# Patient Record
Sex: Male | Born: 1935 | Race: Black or African American | Hispanic: No | Marital: Married | Smoking: Former smoker
Health system: Southern US, Community
[De-identification: ages and names within clinical notes are randomized; demographics above are authoritative.]

## PROBLEM LIST (undated history)

## (undated) DIAGNOSIS — I10 Essential (primary) hypertension: Secondary | ICD-10-CM

## (undated) DIAGNOSIS — J45909 Unspecified asthma, uncomplicated: Secondary | ICD-10-CM

---

## 2011-06-20 DIAGNOSIS — N411 Chronic prostatitis: Secondary | ICD-10-CM | POA: Insufficient documentation

## 2011-10-29 ENCOUNTER — Encounter: Payer: Self-pay | Admitting: *Deleted

## 2011-10-29 ENCOUNTER — Emergency Department (HOSPITAL_BASED_OUTPATIENT_CLINIC_OR_DEPARTMENT_OTHER)
Admission: EM | Admit: 2011-10-29 | Discharge: 2011-10-29 | Disposition: A | Discharge status: Home / Self Care | Payer: Medicare Other | Attending: Emergency Medicine | Admitting: Emergency Medicine

## 2011-10-29 DIAGNOSIS — I10 Essential (primary) hypertension: Secondary | ICD-10-CM | POA: Insufficient documentation

## 2011-10-29 DIAGNOSIS — J029 Acute pharyngitis, unspecified: Secondary | ICD-10-CM | POA: Insufficient documentation

## 2011-10-29 HISTORY — DX: Essential (primary) hypertension: I10

## 2011-10-29 NOTE — ED Notes (Signed)
Pt states he has had a sore throat today and the gland on the left side of his neck also hurts

## 2011-10-29 NOTE — ED Provider Notes (Signed)
History     CSN: [id number]  Arrival date & time 10/29/11  2202   First MD Initiated Contact with Patient 10/29/11 2259      Chief Complaint  Patient presents with  . Sore Throat    (Consider location/radiation/quality/duration/timing/severity/associated sxs/prior treatment) HPI Comments: Patient presents with one day of scratchy and sore throat.  He notes that this morning her scratching when he first woke up.  He went to church and after church started noticing increasing sore throat feeling.  He has pain with swallowing but can swallow normally.  No changes in his voice.  No difficulty breathing.  Denies any cough is not sure these have any fevers at home.  He feels that he may have a gland of the left side of his neck that is slightly swollen.  Denies any dental pain.  No nausea or vomiting.  No chest pain.  Patient is a 77 y.o. male presenting with pharyngitis. The history is provided by the patient.  Sore Throat This is a new problem. The current episode started 6 to 12 hours ago. The problem occurs constantly. The problem has been gradually worsening. Pertinent negatives include no chest pain, no abdominal pain, no headaches and no shortness of breath. The symptoms are aggravated by swallowing. The symptoms are relieved by nothing. He has tried nothing for the symptoms.    Past Medical History  Diagnosis Date  . Hypertension     History reviewed. No pertinent past surgical history.  History reviewed. No pertinent family history.  History  Substance Use Topics  . Smoking status: Never Smoker   . Smokeless tobacco: Not on file  . Alcohol Use: No      Review of Systems  Constitutional: Negative.  Negative for fever and chills.  HENT: Positive for sore throat.   Eyes: Negative.  Negative for discharge and redness.  Respiratory: Negative.  Negative for cough and shortness of breath.   Cardiovascular: Negative.  Negative for chest pain.  Gastrointestinal: Negative.   Negative for nausea, vomiting and abdominal pain.  Genitourinary: Negative.  Negative for hematuria.  Musculoskeletal: Negative.  Negative for back pain.  Skin: Negative.  Negative for color change and rash.  Neurological: Negative for syncope and headaches.  Hematological: Negative.  Negative for adenopathy.  Psychiatric/Behavioral: Negative.  Negative for confusion.  All other systems reviewed and are negative.    Allergies  Review of patient's allergies indicates no known allergies.  Home Medications   Current Outpatient Rx  Name Route Sig Dispense Refill  . AMLODIPINE BESYLATE 10 MG PO TABS Oral Take 10 mg by mouth daily.      . BUMETANIDE 1 MG PO TABS Oral Take 1 mg by mouth daily.      Marland Kitchen METOPROLOL TARTRATE 50 MG PO TABS Oral Take 50 mg by mouth 2 (two) times daily.        BP 175/82  Pulse 81  Temp(Src) 99.5 F (37.5 C) (Oral)  Resp 20  Ht 6\' 5"  (1.956 m)  Wt 270 lb (122.471 kg)  BMI 32.02 kg/m2  SpO2 98%  Physical Exam  Constitutional: He is oriented to person, place, and time. He appears well-developed and well-nourished.  Non-toxic appearance. He does not have a sickly appearance.  HENT:  Head: Normocephalic and atraumatic.  Mouth/Throat: Oropharynx is clear and moist. No oropharyngeal exudate.  Eyes: Conjunctivae, EOM and lids are normal. Pupils are equal, round, and reactive to light.  Neck: Trachea normal, normal range of motion and full  passive range of motion without pain. Neck supple.  Cardiovascular: Normal rate, regular rhythm and normal heart sounds.   Pulmonary/Chest: Effort normal and breath sounds normal. No respiratory distress. He has no wheezes. He has no rales.  Abdominal: Soft. Normal appearance. He exhibits no distension. There is no tenderness. There is no rebound and no CVA tenderness.  Musculoskeletal: Normal range of motion.  Lymphadenopathy:    He has no cervical adenopathy.  Neurological: He is alert and oriented to person, place, and  time. He has normal strength.  Skin: Skin is warm, dry and intact. No rash noted.  Psychiatric: He has a normal mood and affect. His behavior is normal. Thought content normal.    ED Course  Procedures (including critical care time)  Results for orders placed during the hospital encounter of 10/29/11  RAPID STREP SCREEN      Component Value Range   Streptococcus, Group A Screen (Direct) NEGATIVE  NEGATIVE         MDM  Patient appears well at this point in time.  He has no significant oral swelling.  History of screen is negative.  He has a mild low-grade temperature but normal oxygen saturation and clear lung sounds on exam without any history of shortness of breath or cough to indicate pneumonia.  Patient also has no smoking history for this to be a COPD exacerbation.  I do not feel any lymphadenopathy or see any signs of dental infection at this point in time to indicate need for antibiotics for dental abscess.  Patient will begin cautions to return.        Nat Christen, MD 10/29/11 [phone]

## 2013-04-28 DIAGNOSIS — M159 Polyosteoarthritis, unspecified: Secondary | ICD-10-CM | POA: Insufficient documentation

## 2013-12-16 DIAGNOSIS — R600 Localized edema: Secondary | ICD-10-CM | POA: Insufficient documentation

## 2013-12-16 DIAGNOSIS — G4700 Insomnia, unspecified: Secondary | ICD-10-CM | POA: Insufficient documentation

## 2013-12-20 DIAGNOSIS — E785 Hyperlipidemia, unspecified: Secondary | ICD-10-CM | POA: Insufficient documentation

## 2013-12-20 DIAGNOSIS — I10 Essential (primary) hypertension: Secondary | ICD-10-CM | POA: Insufficient documentation

## 2014-02-25 DIAGNOSIS — M542 Cervicalgia: Secondary | ICD-10-CM | POA: Insufficient documentation

## 2014-03-31 DIAGNOSIS — M1712 Unilateral primary osteoarthritis, left knee: Secondary | ICD-10-CM | POA: Insufficient documentation

## 2014-04-27 DIAGNOSIS — J4520 Mild intermittent asthma, uncomplicated: Secondary | ICD-10-CM | POA: Insufficient documentation

## 2014-05-27 DIAGNOSIS — N3281 Overactive bladder: Secondary | ICD-10-CM | POA: Insufficient documentation

## 2014-08-12 DIAGNOSIS — H5440 Blindness, one eye, unspecified eye: Secondary | ICD-10-CM | POA: Insufficient documentation

## 2014-11-06 HISTORY — PX: KNEE SURGERY: SHX244

## 2015-05-06 DIAGNOSIS — Z96652 Presence of left artificial knee joint: Secondary | ICD-10-CM | POA: Insufficient documentation

## 2015-06-23 DIAGNOSIS — L309 Dermatitis, unspecified: Secondary | ICD-10-CM | POA: Insufficient documentation

## 2015-12-01 DIAGNOSIS — G4733 Obstructive sleep apnea (adult) (pediatric): Secondary | ICD-10-CM | POA: Insufficient documentation

## 2016-06-06 DIAGNOSIS — I499 Cardiac arrhythmia, unspecified: Secondary | ICD-10-CM | POA: Insufficient documentation

## 2016-06-19 DIAGNOSIS — D649 Anemia, unspecified: Secondary | ICD-10-CM | POA: Insufficient documentation

## 2016-09-29 DIAGNOSIS — I499 Cardiac arrhythmia, unspecified: Secondary | ICD-10-CM | POA: Insufficient documentation

## 2017-01-08 DIAGNOSIS — H16423 Pannus (corneal), bilateral: Secondary | ICD-10-CM | POA: Insufficient documentation

## 2017-01-08 DIAGNOSIS — H16223 Keratoconjunctivitis sicca, not specified as Sjogren's, bilateral: Secondary | ICD-10-CM | POA: Insufficient documentation

## 2017-02-10 ENCOUNTER — Emergency Department (HOSPITAL_BASED_OUTPATIENT_CLINIC_OR_DEPARTMENT_OTHER)
Admission: EM | Admit: 2017-02-10 | Discharge: 2017-02-10 | Disposition: A | Discharge status: Home / Self Care | Payer: Medicare Other | Attending: Emergency Medicine | Admitting: Emergency Medicine

## 2017-02-10 ENCOUNTER — Encounter (HOSPITAL_BASED_OUTPATIENT_CLINIC_OR_DEPARTMENT_OTHER): Payer: Self-pay | Admitting: *Deleted

## 2017-02-10 DIAGNOSIS — Z87891 Personal history of nicotine dependence: Secondary | ICD-10-CM | POA: Insufficient documentation

## 2017-02-10 DIAGNOSIS — I10 Essential (primary) hypertension: Secondary | ICD-10-CM | POA: Insufficient documentation

## 2017-02-10 DIAGNOSIS — Z79899 Other long term (current) drug therapy: Secondary | ICD-10-CM | POA: Diagnosis not present

## 2017-02-10 DIAGNOSIS — R21 Rash and other nonspecific skin eruption: Secondary | ICD-10-CM | POA: Diagnosis present

## 2017-02-10 DIAGNOSIS — L509 Urticaria, unspecified: Secondary | ICD-10-CM

## 2017-02-10 MED ORDER — PREDNISONE 20 MG PO TABS: ORAL_TABLET | ORAL | 0 refills | Status: AC

## 2017-02-10 NOTE — ED Provider Notes (Signed)
[facility] Provider Note   CSN: [id number] Arrival date & time: 02/10/17  1411  By signing my name below, I, Nelwyn Salisbury, attest that this documentation has been prepared under the direction and in the presence of Rolan Bucco, MD . Electronically Signed: Nelwyn Salisbury, Scribe. 02/10/2017. 5:41 PM.  History   Chief Complaint Chief Complaint  Patient presents with  . Rash   The history is provided by the patient. No language interpreter was used.    HPI Comments:  Johnny Acosta is a 83 y.o. male who presents to the Emergency Department complaining of constant, worsening rash onset 1 month. He states that his rash began on his scalp and has worsened to his arms and chest. Pt reports associated itching. Pt was seen by dermatology a month ago and given IM steroids and Betamethasone cream which he has used with no relief. Denies any SOB, wheezing or facial swelling. No changes in medications or potential environmental allergens indicated.   Past Medical History:  Diagnosis Date  . Hypertension     There are no active problems to display for this patient.   Past Surgical History:  Procedure Laterality Date  . KNEE SURGERY Bilateral 2016       Home Medications    Prior to Admission medications   Medication Sig Start Date End Date Taking? Authorizing Provider  amLODipine (NORVASC) 10 MG tablet Take 10 mg by mouth daily.     Yes Historical Provider, MD  bumetanide (BUMEX) 1 MG tablet Take 1 mg by mouth daily.     Yes Historical Provider, MD  fish oil-omega-3 fatty acids 1000 MG capsule Take 1 g by mouth daily.     Yes Historical Provider, MD  metoprolol (LOPRESSOR) 50 MG tablet Take 50 mg by mouth 2 (two) times daily.      Historical Provider, MD  predniSONE (DELTASONE) 20 MG tablet 3 tabs po day one, then 2 po daily x 4 days 02/10/17   Rolan Bucco, MD  traMADol (ULTRAM) 50 MG tablet Take 50 mg by mouth every 6 (six) hours as needed. For pain. Maximum dose= 8 tablets  per day     Historical Provider, MD    Family History No family history on file.  Social History Social History  Substance Use Topics  . Smoking status: Former Games developer  . Smokeless tobacco: Never Used  . Alcohol use No     Allergies   Patient has no known allergies.   Review of Systems Review of Systems  Constitutional: Negative for chills, diaphoresis, fatigue and fever.  HENT: Negative for congestion, rhinorrhea and sneezing.   Eyes: Negative.   Respiratory: Negative for cough, chest tightness and shortness of breath.   Cardiovascular: Negative for chest pain and leg swelling.  Gastrointestinal: Negative for abdominal pain, blood in stool, diarrhea, nausea and vomiting.  Genitourinary: Negative for difficulty urinating, flank pain, frequency and hematuria.  Musculoskeletal: Negative for arthralgias and back pain.  Skin: Positive for rash.       Positive for Itching  Neurological: Negative for dizziness, speech difficulty, weakness, numbness and headaches.     Physical Exam Updated Vital Signs BP (!) 148/71 (BP Location: Right Arm)   Pulse 70   Temp 98.7 F (37.1 C) (Oral)   Resp 20   Ht  (1.956 m)   Wt 270 lb (122.5 kg)   SpO2 98%   BMI 32.02 kg/m   Physical Exam  Constitutional: He is oriented to person, place, and time.  He appears well-developed and well-nourished.  HENT:  Head: Normocephalic and atraumatic.  Eyes: Pupils are equal, round, and reactive to light.  Neck: Normal range of motion. Neck supple.  Cardiovascular: Normal rate, regular rhythm and normal heart sounds.   Pulmonary/Chest: Effort normal and breath sounds normal. No respiratory distress. He has no wheezes. He has no rales. He exhibits no tenderness.  Abdominal: Soft. Bowel sounds are normal. There is no tenderness. There is no rebound and no guarding.  Musculoskeletal: Normal range of motion. He exhibits no edema.  Lymphadenopathy:    He has no cervical adenopathy.  Neurological:  He is alert and oriented to person, place, and time.  Skin: Skin is warm and dry. Rash noted.  Patient has a slightly raised erythematous rash to his scalp and trunk. There is no vesicles. He has some erythematous papules on the dorsum of both hands. There is no rash to the palms. No petechiae or purpura. No rash in the web spaces.  Psychiatric: He has a normal mood and affect.     ED Treatments / Results  DIAGNOSTIC STUDIES:  Oxygen Saturation is 98% on RA, normal by my interpretation.    COORDINATION OF CARE:  5:46 PM Discussed treatment plan with pt at bedside which includes oral steroids and moisturizing cream and pt agreed to plan.  Labs (all labs ordered are listed, but only abnormal results are displayed) Labs Reviewed - No data to display  EKG  EKG Interpretation None       Radiology No results found.  Procedures Procedures (including critical care time)  Medications Ordered in ED Medications - No data to display   Initial Impression / Assessment and Plan / ED Course  I have reviewed the triage vital signs and the nursing notes.  Pertinent labs & imaging results that were available during my care of the patient were reviewed by me and considered in my medical decision making (see chart for details).     Patient presents with an urticarial type rash. Doesn't appear to be consistent with scabies. There is no airway involvement. We'll try a course of oral steroids. I encouraged him to follow back up with his dermatologist. I also encouraged him to use a moisturizing cream such as Aquaphor.  Final Clinical Impressions(s) / ED Diagnoses   Final diagnoses:  Urticaria    New Prescriptions New Prescriptions   PREDNISONE (DELTASONE) 20 MG TABLET    3 tabs po day one, then 2 po daily x 4 days  I personally performed the services described in this documentation, which was scribed in my presence.  The recorded information has been reviewed and considered.        Rolan Bucco, MD 02/10/17 [phone]

## 2017-02-10 NOTE — ED Notes (Addendum)
Patient denies pain and is resting comfortably.  

## 2017-02-10 NOTE — ED Triage Notes (Signed)
Pt reports itchy rash (mostly on bil hands and back of neck) x57month. Reports being prescribed a cream that's been ineffective. Denies sob, pain, fever, n/v/d.

## 2017-04-30 DIAGNOSIS — F3341 Major depressive disorder, recurrent, in partial remission: Secondary | ICD-10-CM | POA: Insufficient documentation

## 2017-04-30 DIAGNOSIS — R5383 Other fatigue: Secondary | ICD-10-CM | POA: Insufficient documentation

## 2017-04-30 DIAGNOSIS — F32A Depression, unspecified: Secondary | ICD-10-CM | POA: Insufficient documentation

## 2017-05-03 DIAGNOSIS — H5203 Hypermetropia, bilateral: Secondary | ICD-10-CM | POA: Insufficient documentation

## 2017-05-30 DIAGNOSIS — Z8546 Personal history of malignant neoplasm of prostate: Secondary | ICD-10-CM | POA: Insufficient documentation

## 2018-01-24 ENCOUNTER — Other Ambulatory Visit: Payer: Self-pay

## 2018-01-24 ENCOUNTER — Emergency Department (HOSPITAL_BASED_OUTPATIENT_CLINIC_OR_DEPARTMENT_OTHER): Payer: Medicare Other

## 2018-01-24 ENCOUNTER — Encounter (HOSPITAL_BASED_OUTPATIENT_CLINIC_OR_DEPARTMENT_OTHER): Payer: Self-pay | Admitting: *Deleted

## 2018-01-24 ENCOUNTER — Emergency Department (HOSPITAL_BASED_OUTPATIENT_CLINIC_OR_DEPARTMENT_OTHER)
Admission: EM | Admit: 2018-01-24 | Discharge: 2018-01-24 | Disposition: A | Discharge status: Home / Self Care | Payer: Medicare Other | Attending: Emergency Medicine | Admitting: Emergency Medicine

## 2018-01-24 DIAGNOSIS — I10 Essential (primary) hypertension: Secondary | ICD-10-CM | POA: Insufficient documentation

## 2018-01-24 DIAGNOSIS — R079 Chest pain, unspecified: Secondary | ICD-10-CM

## 2018-01-24 DIAGNOSIS — Z79899 Other long term (current) drug therapy: Secondary | ICD-10-CM | POA: Insufficient documentation

## 2018-01-24 DIAGNOSIS — R0789 Other chest pain: Secondary | ICD-10-CM | POA: Insufficient documentation

## 2018-01-24 DIAGNOSIS — Z87891 Personal history of nicotine dependence: Secondary | ICD-10-CM | POA: Insufficient documentation

## 2018-01-24 LAB — CBC
HCT: 31.3 % — ABNORMAL LOW (ref 39.0–52.0)
Hemoglobin: 10.4 g/dL — ABNORMAL LOW (ref 13.0–17.0)
MCH: 29.3 pg (ref 26.0–34.0)
MCHC: 33.2 g/dL (ref 30.0–36.0)
MCV: 88.2 fL (ref 78.0–100.0)
PLATELETS: 207 10*3/uL (ref 150–400)
RBC: 3.55 MIL/uL — AB (ref 4.22–5.81)
RDW: 13.1 % (ref 11.5–15.5)
WBC: 4.3 10*3/uL (ref 4.0–10.5)

## 2018-01-24 LAB — BASIC METABOLIC PANEL
Anion gap: 9 (ref 5–15)
BUN: 25 mg/dL — AB (ref 6–20)
CO2: 22 mmol/L (ref 22–32)
CREATININE: 1.16 mg/dL (ref 0.61–1.24)
Calcium: 9 mg/dL (ref 8.9–10.3)
Chloride: 102 mmol/L (ref 101–111)
GFR, EST NON AFRICAN AMERICAN: 55 mL/min — AB (ref >60)
Glucose, Bld: 93 mg/dL (ref 65–99)
Potassium: 3.9 mmol/L (ref 3.5–5.1)
SODIUM: 133 mmol/L — AB (ref 135–145)

## 2018-01-24 LAB — TROPONIN I

## 2018-01-24 NOTE — ED Provider Notes (Signed)
3:41 PM Care assumed from Dr. Ethelda ChickJacubowitz.  At time of transfer care, patient is awaiting delta troponin for ruling out cardiac etiology of his chest pain.  Patient had a complete heart score of 3 per the previous team and will be stable for discharge home if troponin is negative on repeat.    Anticipate reassessment after troponin.  6:33 PM Delta troponin was negative.  Patient was feeling well and had no further symptoms.  Based on the previous plan, patient will be discharged home for outpatient follow-up.  Patient agreed with plan of care and was discharged in good condition with improved symptoms.   Clinical Impression: 1. Chest pain, unspecified type     Disposition: Discharge  Condition: Good  I have discussed the results, Dx and Tx plan with the pt(& family if present). He/she/they expressed understanding and agree(s) with the plan. Discharge instructions discussed at great length. Strict return precautions discussed and pt &/or family have verbalized understanding of the instructions. No further questions at time of discharge.    New Prescriptions   No medications on file    Follow Up: Drucie OpitzArnold, Gordon, MD [address] [place] [address] [phone]        Mercede Rollo, Canary Brimhristopher J, MD 01/25/18 [phone]

## 2018-01-24 NOTE — ED Provider Notes (Signed)
[facility] EMERGENCY DEPARTMENT Provider Note   CSN: [id number] Arrival date & time: 01/24/18  1242     History   Chief Complaint Chief Complaint  Patient presents with  . Chest Pain    HPI Johnny Acosta is a 84 y.o. male.  Complains of left anterior chest pain covering a 1 cm area over his left parasternal area onset yesterday morning.  Pain is intermittent lasts a split second at the time feels like "a sticking sensation" no shortness of breath no nausea no sweatiness nothing makes symptoms better or worse.  Presently asymptomatic.  No treatment prior to coming  HPI  Past Medical History:  Diagnosis Date  . Hypertension     There are no active problems to display for this patient.   Past Surgical History:  Procedure Laterality Date  . KNEE SURGERY Bilateral 2016       Home Medications    Prior to Admission medications   Medication Sig Start Date End Date Taking? Authorizing Provider  amLODipine (NORVASC) 10 MG tablet Take 10 mg by mouth daily.      [provider]  bumetanide (BUMEX) 1 MG tablet Take 1 mg by mouth daily.      [provider]  fish oil-omega-3 fatty acids 1000 MG capsule Take 1 g by mouth daily.      [provider]  metoprolol (LOPRESSOR) 50 MG tablet Take 50 mg by mouth 2 (two) times daily.      [provider]  predniSONE (DELTASONE) 20 MG tablet 3 tabs po day one, then 2 po daily x 4 days 02/10/17   Rolan Bucco, MD  traMADol (ULTRAM) 50 MG tablet Take 50 mg by mouth every 6 (six) hours as needed. For pain. Maximum dose= 8 tablets per day     [provider]    Family History No family history on file. Family history negative for MI Social History Social History   Tobacco Use  . Smoking status: Former Games developer  . Smokeless tobacco: Never Used  Substance Use Topics  . Alcohol use: No  . Drug use: No   exSmoker quit 30 years ago  Allergies   Patient has no known  allergies.   Review of Systems Review of Systems  Constitutional: Negative.   HENT: Negative.   Respiratory: Negative.   Cardiovascular: Positive for chest pain.  Gastrointestinal: Negative.   Musculoskeletal: Negative.   Skin: Negative.   Neurological: Negative.   Psychiatric/Behavioral: Negative.   All other systems reviewed and are negative.    Physical Exam Updated Vital Signs BP 136/89   Pulse 64   Temp 97.7 F (36.5 C) (Oral)   Resp 16   Ht 6\' 5"  (1.956 m)   Wt 124.7 kg (275 lb)   SpO2 99%   BMI 32.61 kg/m   Physical Exam  Constitutional: He appears well-developed and well-nourished.  HENT:  Head: Normocephalic and atraumatic.  Eyes: Pupils are equal, round, and reactive to light. Conjunctivae are normal.  Neck: Neck supple. No tracheal deviation present. No thyromegaly present.  Cardiovascular: Normal rate and regular rhythm.  No murmur heard. Pulmonary/Chest: Effort normal and breath sounds normal.  Abdominal: Soft. Bowel sounds are normal. He exhibits no distension. There is no tenderness.  Musculoskeletal: Normal range of motion. He exhibits no edema or tenderness.  Neurological: He is alert. Coordination normal.  Skin: Skin is warm and dry. No rash noted.  Psychiatric: He has a normal mood and affect.  Nursing note  and vitals reviewed.    ED Treatments / Results  Labs (all labs ordered are listed, but only abnormal results are displayed) Labs Reviewed  BASIC METABOLIC PANEL  CBC  TROPONIN I  TROPONIN I    EKG  EKG Interpretation  Date/Time:  Thursday January 24 2018 12:47:55 EDT Ventricular Rate:  64 PR Interval:  150 QRS Duration: 96 QT Interval:  420 QTC Calculation: 433 R Axis:   36 Text Interpretation:  Normal sinus rhythm Septal infarct , age undetermined Abnormal ECG No old tracing to compare Confirmed by ElysburgJacubowitz, Doreatha MartinSam [phone]) on 01/24/2018 1:08:16 PM       Radiology Dg Chest 2 View  Result Date: 01/24/2018 CLINICAL DATA:   Chest pain. EXAM: CHEST - 2 VIEW COMPARISON:  None. FINDINGS: The heart size and mediastinal contours are within normal limits. Both lungs are clear. The visualized skeletal structures are unremarkable. IMPRESSION: No active cardiopulmonary disease. Electronically Signed   By: Francene BoyersJames  Maxwell M.D.   On: 01/24/2018 13:17    Procedures Procedures (including critical care time)  Medications Ordered in ED Medications - No data to display  Chest x-ray viewed by me Initial Impression / Assessment and Plan / ED Course  I have reviewed the triage vital signs and the nursing notes.  Pertinent labs & imaging results that were available during my care of the patient were reviewed by me and considered in my medical decision making (see chart for details).     3:40 PM patient remains asymptomatic.  Strongly doubt acute coronary syndrome with highly atypical symptoms.  Heart score equals 3.  Second troponin pending. Pt signed out to Dr Tegeler 340 pm  Final Clinical Impressions(s) / ED Diagnoses  Dx atypical chest pain Final diagnoses:  None    ED Discharge Orders    None       Doug SouJacubowitz, Janey Petron, MD 01/24/18 1547

## 2018-01-24 NOTE — ED Triage Notes (Signed)
Chest pain last night. Sharp pain in his left chest that comes and goes.

## 2018-01-24 NOTE — Discharge Instructions (Signed)
Your workup today was overall reassuring and your cardiac enzymes were negative twice.  Please follow-up with your primary doctor in the next several days for reassessment.  If any symptoms change or worsen, please return to the nearest emergency department.

## 2018-03-23 ENCOUNTER — Other Ambulatory Visit: Payer: Self-pay

## 2018-03-23 ENCOUNTER — Emergency Department (HOSPITAL_BASED_OUTPATIENT_CLINIC_OR_DEPARTMENT_OTHER)
Admission: EM | Admit: 2018-03-23 | Discharge: 2018-03-23 | Disposition: A | Discharge status: Home / Self Care | Payer: Medicare Other | Attending: Emergency Medicine | Admitting: Emergency Medicine

## 2018-03-23 ENCOUNTER — Encounter (HOSPITAL_BASED_OUTPATIENT_CLINIC_OR_DEPARTMENT_OTHER): Payer: Self-pay | Admitting: Emergency Medicine

## 2018-03-23 DIAGNOSIS — Z87891 Personal history of nicotine dependence: Secondary | ICD-10-CM | POA: Diagnosis not present

## 2018-03-23 DIAGNOSIS — Z79899 Other long term (current) drug therapy: Secondary | ICD-10-CM | POA: Insufficient documentation

## 2018-03-23 DIAGNOSIS — Y999 Unspecified external cause status: Secondary | ICD-10-CM | POA: Diagnosis not present

## 2018-03-23 DIAGNOSIS — Y93G1 Activity, food preparation and clean up: Secondary | ICD-10-CM | POA: Diagnosis not present

## 2018-03-23 DIAGNOSIS — Y929 Unspecified place or not applicable: Secondary | ICD-10-CM | POA: Diagnosis not present

## 2018-03-23 DIAGNOSIS — S61211A Laceration without foreign body of left index finger without damage to nail, initial encounter: Secondary | ICD-10-CM | POA: Diagnosis present

## 2018-03-23 DIAGNOSIS — W260XXA Contact with knife, initial encounter: Secondary | ICD-10-CM | POA: Diagnosis not present

## 2018-03-23 DIAGNOSIS — I10 Essential (primary) hypertension: Secondary | ICD-10-CM | POA: Diagnosis not present

## 2018-03-23 MED ORDER — LIDOCAINE HCL (PF) 1 % IJ SOLN
5.0000 mL | Freq: Once | INTRAMUSCULAR | Status: AC
  Administered 2018-03-23: 5 mL
  Filled 2018-03-23: qty 5

## 2018-03-23 NOTE — ED Provider Notes (Signed)
[facility] EMERGENCY DEPARTMENT Provider Note   CSN: [id number] Arrival date & time: 03/23/18  1108     History   Chief Complaint Chief Complaint  Patient presents with  . Laceration    HPI Johnny Acosta is a 84 y.o. male.  HPI  84 year old African-American male past medical history significant for hypertension presents to the emergency department today for evaluation of laceration to the left index finger.  Patient states that he was cutting potatoes when he sliced his left index finger.  Patient denies any difficulties with range of motion.  Denies any paresthesias or weakness.  States his last tetanus shot was less than 10 years ago.  Patient denies any pain at this time.  Denies any other associated symptoms.  Past Medical History:  Diagnosis Date  . Hypertension     There are no active problems to display for this patient.   Past Surgical History:  Procedure Laterality Date  . KNEE SURGERY Bilateral 2016        Home Medications    Prior to Admission medications   Medication Sig Start Date End Date Taking? Authorizing Provider  amLODipine (NORVASC) 10 MG tablet Take 10 mg by mouth daily.      [provider]  bumetanide (BUMEX) 1 MG tablet Take 1 mg by mouth daily.      [provider]  fish oil-omega-3 fatty acids 1000 MG capsule Take 1 g by mouth daily.      [provider]  metoprolol (LOPRESSOR) 50 MG tablet Take 50 mg by mouth 2 (two) times daily.      [provider]  predniSONE (DELTASONE) 20 MG tablet 3 tabs po day one, then 2 po daily x 4 days 02/10/17   Rolan Bucco, MD  traMADol (ULTRAM) 50 MG tablet Take 50 mg by mouth every 6 (six) hours as needed. For pain. Maximum dose= 8 tablets per day     [provider]    Family History No family history on file.  Social History Social History   Tobacco Use  . Smoking status: Former Games developer  . Smokeless tobacco: Never Used  Substance Use Topics    . Alcohol use: No  . Drug use: No     Allergies   Patient has no known allergies.   Review of Systems Review of Systems  Constitutional: Negative for chills and fever.  Musculoskeletal: Positive for myalgias.  Skin: Positive for wound.  Neurological: Negative for weakness and numbness.     Physical Exam Updated Vital Signs BP 127/72 (BP Location: Right Arm)   Temp 98.1 F (36.7 C) (Oral)   Resp 18   Ht  (1.956 m)   Wt 124.7 kg (275 lb)   SpO2 98%   BMI 32.61 kg/m   Physical Exam  Constitutional: He appears well-developed and well-nourished. No distress.  HENT:  Head: Normocephalic and atraumatic.  Eyes: Right eye exhibits no discharge. Left eye exhibits no discharge. No scleral icterus.  Neck: Normal range of motion.  Cardiovascular: Intact distal pulses.  Pulmonary/Chest: No respiratory distress.  Musculoskeletal: Normal range of motion.  Full range of motion of all joints of the left hand.  Brisk cap refill.  Sensation intact.  Strength of the DIP, PIP and MC joint of the second digit is normal.  Neurological: He is alert.  Skin: Skin is warm and dry. Capillary refill takes less than 2 seconds. No pallor.  With 2.5 cm laceration to the left index finger.  This is on the palmar aspect of the finger.  It is just above the metacarpal joint.  There is no obvious tendon involvement.  Psychiatric: His behavior is normal. Judgment and thought content normal.  Nursing note and vitals reviewed.    ED Treatments / Results  Labs (all labs ordered are listed, but only abnormal results are displayed) Labs Reviewed - No data to display  EKG None  Radiology No results found.  Procedures .Marland KitchenLaceration Repair Date/Time: 03/23/2018 12:48 PM Performed by: Rise Mu, PA-C Authorized by: Rise Mu, PA-C   Consent:    Consent obtained:  Verbal   Consent given by:  Patient   Risks discussed:  Infection, need for additional repair, pain, poor  wound healing, poor cosmetic result, nerve damage, vascular damage, tendon damage and retained foreign body   Alternatives discussed:  No treatment Anesthesia (see MAR for exact dosages):    Anesthesia method:  Local infiltration   Local anesthetic:  Lidocaine 1% w/o epi Laceration details:    Location:  Finger   Finger location:  L index finger   Length (cm):  2.5 Repair type:    Repair type:  Simple Pre-procedure details:    Preparation:  Patient was prepped and draped in usual sterile fashion Exploration:    Wound exploration: wound explored through full range of motion and entire depth of wound probed and visualized     Wound extent: no foreign bodies/material noted and no tendon damage noted     Contaminated: no   Treatment:    Area cleansed with:  Betadine and saline   Amount of cleaning:  Standard   Irrigation solution:  Sterile saline   Irrigation volume:  60   Irrigation method:  Pressure wash   Visualized foreign bodies/material removed: no   Skin repair:    Repair method:  Sutures   Suture size:  4-0   Suture material:  Prolene   Suture technique:  Simple interrupted   Number of sutures:  5 Approximation:    Approximation:  Close Post-procedure details:    Dressing:  Antibiotic ointment, bulky dressing and splint for protection   Patient tolerance of procedure:  Tolerated well, no immediate complications   (including critical care time)  SPLINT APPLICATION Date/Time: 12:50 PM Authorized by: Demetrios Loll Consent: Verbal consent obtained. Risks and benefits: risks, benefits and alternatives were discussed Consent given by: patient Splint applied by: orthopedic technician Location details: Left index finger Splint type: Metal finger splint Supplies used: Metal finger splint Post-procedure: The splinted body part was neurovascularly unchanged following the procedure. Patient tolerance: Patient tolerated the procedure well with no immediate  complications.     Medications Ordered in ED Medications  lidocaine (PF) (XYLOCAINE) 1 % injection 5 mL (has no administration in time range)     Initial Impression / Assessment and Plan / ED Course  I have reviewed the triage vital signs and the nursing notes.  Pertinent labs & imaging results that were available during my care of the patient were reviewed by me and considered in my medical decision making (see chart for details).     Tdap is utd. Pressure irrigation performed. Laceration occurred < 8 hours prior to repair which was well tolerated.  She is neurovascularly intact.  Full range of motion.  No indication for imaging as per wound is not contaminated.  Doubt any foreign body or retained.  Pt has no co morbidities to effect normal wound healing. Discussed suture home care w pt  and answered questions. Pt to f-u for wound check and suture removal in 7 days. Pt is hemodynamically stable w no complaints prior to dc.     Final Clinical Impressions(s) / ED Diagnoses   Final diagnoses:  Laceration of left index finger without foreign body without damage to nail, initial encounter    ED Discharge Orders    None       Wallace Keller 03/23/18 1250    Raeford Razor, MD 03/23/18 1339

## 2018-03-23 NOTE — ED Notes (Signed)
Left index finger lac irrigated and cleaned. Bleeding controlled w/ pressure dressing.

## 2018-03-23 NOTE — ED Triage Notes (Signed)
Laceration to L index finger while slicing potatoes.

## 2018-03-23 NOTE — Discharge Instructions (Signed)
WOUND CARE °Please have your stitches/staples removed in 7 or sooner if you have concerns. You may do this at any available urgent care or at your primary care doctor's office. ° Keep area clean and dry for 24 hours. Do not remove °bandage, if applied. ° After 24 hours, remove bandage and wash wound °gently with mild soap and warm water. Reapply °a new bandage after cleaning wound, if directed. ° Continue daily cleansing with soap and water until °stitches/staples are removed. ° Do not apply any ointments or creams to the wound °while stitches/staples are in place, as this may cause °delayed healing. ° Seek medical careif you experience any of the following °signs of infection: Swelling, redness, pus drainage, °streaking, fever >101.0 F ° Seek care if you experience excessive bleeding °that does not stop after 15-20 minutes of constant, firm °pressure. ° ° °

## 2018-03-23 NOTE — ED Notes (Signed)
ED Provider at bedside. 

## 2018-03-29 ENCOUNTER — Encounter (HOSPITAL_BASED_OUTPATIENT_CLINIC_OR_DEPARTMENT_OTHER): Payer: Self-pay | Admitting: *Deleted

## 2018-03-29 ENCOUNTER — Emergency Department (HOSPITAL_BASED_OUTPATIENT_CLINIC_OR_DEPARTMENT_OTHER)
Admission: EM | Admit: 2018-03-29 | Discharge: 2018-03-29 | Disposition: A | Discharge status: Home / Self Care | Payer: Medicare Other | Attending: Emergency Medicine | Admitting: Emergency Medicine

## 2018-03-29 ENCOUNTER — Other Ambulatory Visit: Payer: Self-pay

## 2018-03-29 DIAGNOSIS — Z87891 Personal history of nicotine dependence: Secondary | ICD-10-CM | POA: Insufficient documentation

## 2018-03-29 DIAGNOSIS — W260XXD Contact with knife, subsequent encounter: Secondary | ICD-10-CM | POA: Insufficient documentation

## 2018-03-29 DIAGNOSIS — S61211D Laceration without foreign body of left index finger without damage to nail, subsequent encounter: Secondary | ICD-10-CM | POA: Insufficient documentation

## 2018-03-29 DIAGNOSIS — S6992XD Unspecified injury of left wrist, hand and finger(s), subsequent encounter: Secondary | ICD-10-CM | POA: Diagnosis present

## 2018-03-29 DIAGNOSIS — I10 Essential (primary) hypertension: Secondary | ICD-10-CM | POA: Diagnosis not present

## 2018-03-29 DIAGNOSIS — Z4802 Encounter for removal of sutures: Secondary | ICD-10-CM

## 2018-03-29 DIAGNOSIS — Z79899 Other long term (current) drug therapy: Secondary | ICD-10-CM | POA: Diagnosis not present

## 2018-03-29 NOTE — ED Notes (Signed)
Pt verbalizes understanding of d/c instructions and denies any further needs at this time. 

## 2018-03-29 NOTE — ED Triage Notes (Signed)
Here for suture removal placed x 1 week ago

## 2018-03-29 NOTE — ED Provider Notes (Signed)
[facility] EMERGENCY DEPARTMENT Provider Note   CSN: [id number] Arrival date & time: 03/29/18  1444     History   Chief Complaint Chief Complaint  Patient presents with  . Suture / Staple Removal    HPI Johnny Acosta is a 84 y.o. male with past medical history significant for hypertension presenting for suture removal of the left anterior index finger.  Patient was seen this past Friday in the emergency department after the injury.  States that he cut his finger with a knife while cutting a potato.  States that it has been healing well and denies any swelling, pain, purulence or warmth.  He has been feeling very well and has no complaints.  HPI  Past Medical History:  Diagnosis Date  . Hypertension     There are no active problems to display for this patient.   Past Surgical History:  Procedure Laterality Date  . KNEE SURGERY Bilateral 2016        Home Medications    Prior to Admission medications   Medication Sig Start Date End Date Taking? Authorizing Provider  amLODipine (NORVASC) 10 MG tablet Take 10 mg by mouth daily.      [provider]  bumetanide (BUMEX) 1 MG tablet Take 1 mg by mouth daily.      [provider]  fish oil-omega-3 fatty acids 1000 MG capsule Take 1 g by mouth daily.      [provider]  metoprolol (LOPRESSOR) 50 MG tablet Take 50 mg by mouth 2 (two) times daily.      [provider]  predniSONE (DELTASONE) 20 MG tablet 3 tabs po day one, then 2 po daily x 4 days 02/10/17   Rolan Bucco, MD  traMADol (ULTRAM) 50 MG tablet Take 50 mg by mouth every 6 (six) hours as needed. For pain. Maximum dose= 8 tablets per day     [provider]    Family History History reviewed. No pertinent family history.  Social History Social History   Tobacco Use  . Smoking status: Former Games developer  . Smokeless tobacco: Never Used  Substance Use Topics  . Alcohol use: No  . Drug use: No      Allergies   Patient has no known allergies.   Review of Systems Review of Systems  Constitutional: Negative for chills and fever.  Gastrointestinal: Negative for nausea and vomiting.  Musculoskeletal: Negative for myalgias.  Skin: Positive for wound. Negative for color change, pallor and rash.  Neurological: Negative for weakness and numbness.     Physical Exam Updated Vital Signs BP 125/69   Pulse 80   Temp 98.2 F (36.8 C)   Resp 18   Ht  (1.956 m)   Wt 124.7 kg (275 lb)   SpO2 96%   BMI 32.61 kg/m   Physical Exam  Constitutional: He appears well-developed and well-nourished. No distress.  Well-appearing, nontoxic afebrile sitting comfortably in the chair in no acute distress.  HENT:  Head: Normocephalic and atraumatic.  Eyes: EOM are normal.  Neck: Normal range of motion.  Cardiovascular: Normal rate.  Pulmonary/Chest: Effort normal. No respiratory distress.  Musculoskeletal: Normal range of motion. He exhibits no edema.  Neurological: He is alert.  Skin: Skin is warm and dry. Capillary refill takes less than 2 seconds. No rash noted. He is not diaphoretic. No erythema. No pallor.  Left index finger with 5 sutures along the crease of the MCP joint.  Well-healing, no erythema, warmth, purulence.  Full range of motion  Psychiatric: He has a normal mood and affect.  Nursing note and vitals reviewed.    ED Treatments / Results  Labs (all labs ordered are listed, but only abnormal results are displayed) Labs Reviewed - No data to display  EKG None  Radiology No results found.  Procedures .Suture Removal Date/Time: 03/29/2018 5:08 PM Performed by: Georgiana Shore, PA-C Authorized by: Georgiana Shore, PA-C   Consent:    Consent obtained:  Verbal   Consent given by:  Patient   Risks discussed:  Pain, wound separation and bleeding   Alternatives discussed:  No treatment Location:    Location:  Upper extremity   Upper extremity  location:  Hand   Hand location:  L index finger Procedure details:    Wound appearance:  No signs of infection, good wound healing, nonpurulent and nontender   Number of sutures removed:  5 Post-procedure details:    Post-removal:  Steri-Strips applied and dressing applied   Patient tolerance of procedure:  Procedure terminated at patient's request   (including critical care time)  SPLINT APPLICATION Date/Time: 5:13 PM Authorized by: Georgiana Shore Consent: Verbal consent obtained. Risks and benefits: risks, benefits and alternatives were discussed Consent given by: patient Splint applied by: Myself Location details: Left index finger Splint type: Static finger splint Supplies used: Static finger splint Post-procedure: The splinted body part was neurovascularly unchanged following the procedure. Patient tolerance: Patient tolerated the procedure well with no immediate complications.   Medications Ordered in ED Medications - No data to display   Initial Impression / Assessment and Plan / ED Course  I have reviewed the triage vital signs and the nursing notes.  Pertinent labs & imaging results that were available during my care of the patient were reviewed by me and considered in my medical decision making (see chart for details).    Well-appearing 84 year old male presenting for suture removal with good wound healing but some mild dehiscence. 5 sutures were removed without complications.  Applied Steri-Strips, rebandaged the finger and applied clean finger splint.  Will discharge patient home with close follow-up with PCP.  Advised to monitor for any signs of infection return sooner if experiencing any symptoms.  Return precautions discussed and patient understood and agrees with plan.  Final Clinical Impressions(s) / ED Diagnoses   Final diagnoses:  Visit for suture removal    ED Discharge Orders    None       Gregary Cromer 03/29/18 1714     Gerhard Munch, MD 03/30/18 [phone]

## 2018-03-29 NOTE — ED Notes (Signed)
ED Provider at bedside. 

## 2018-03-29 NOTE — Discharge Instructions (Addendum)
As discussed, keep the bandaging clean by wearing gloves as needed and remove in 2 days to recheck the wound.  Wear your splint to prevent any further opening of your wound. Return for any signs of infection and return immediately if swelling, purulence, redness, warmth or increased pain.  Follow-up with your primary care provider early next week.  Return if symptoms worsen or new concerning symptoms in the meantime.

## 2018-04-04 ENCOUNTER — Other Ambulatory Visit: Payer: Self-pay

## 2018-04-04 ENCOUNTER — Encounter (HOSPITAL_BASED_OUTPATIENT_CLINIC_OR_DEPARTMENT_OTHER): Payer: Self-pay | Admitting: Emergency Medicine

## 2018-04-04 ENCOUNTER — Emergency Department (HOSPITAL_BASED_OUTPATIENT_CLINIC_OR_DEPARTMENT_OTHER)
Admission: EM | Admit: 2018-04-04 | Discharge: 2018-04-04 | Disposition: A | Discharge status: Home / Self Care | Payer: Medicare Other | Attending: Emergency Medicine | Admitting: Emergency Medicine

## 2018-04-04 DIAGNOSIS — L089 Local infection of the skin and subcutaneous tissue, unspecified: Secondary | ICD-10-CM | POA: Insufficient documentation

## 2018-04-04 DIAGNOSIS — Z87891 Personal history of nicotine dependence: Secondary | ICD-10-CM | POA: Insufficient documentation

## 2018-04-04 DIAGNOSIS — M79645 Pain in left finger(s): Secondary | ICD-10-CM | POA: Diagnosis present

## 2018-04-04 DIAGNOSIS — T148XXA Other injury of unspecified body region, initial encounter: Secondary | ICD-10-CM

## 2018-04-04 DIAGNOSIS — I10 Essential (primary) hypertension: Secondary | ICD-10-CM | POA: Insufficient documentation

## 2018-04-04 DIAGNOSIS — Z79899 Other long term (current) drug therapy: Secondary | ICD-10-CM | POA: Insufficient documentation

## 2018-04-04 MED ORDER — CEPHALEXIN 500 MG PO CAPS: 500.0000 mg | ORAL_CAPSULE | Freq: Four times a day (QID) | ORAL | 0 refills | Status: DC

## 2018-04-04 MED ORDER — CEPHALEXIN 250 MG PO CAPS
1000.0000 mg | ORAL_CAPSULE | Freq: Once | ORAL | Status: AC
  Administered 2018-04-04: 1000 mg via ORAL
  Filled 2018-04-04: qty 4

## 2018-04-04 NOTE — ED Provider Notes (Addendum)
[facility] Provider Note: Lowella Dell, MD, FACEP  CSN: [id number] MRN: [id number] ARRIVAL: 04/04/18 at 0117 ROOM: [facility]12/[facility]12   CHIEF COMPLAINT  Wound Check   HISTORY OF PRESENT ILLNESS  04/04/18 2:36 AM Johnny Acosta is a 84 y.o. male who cut the volar aspect of the proximal phalanx of his left index finger while slicing potatoes on May 18.  He was seen in the ED and had it sutured. He returned and had his sutures removed on 24 May.  He was not prescribed antibiotics on either occasion.  He returns with pain in that wound which is been severe enough to keep him awake at night.  He denies significant pain at the present time now that nursing staff is removed from his bandage and splint.  There is been no purulent drainage but the wound is open.  He has not had a fever.  Pain is worse with movement or palpation.   Past Medical History:  Diagnosis Date  . Hypertension     Past Surgical History:  Procedure Laterality Date  . KNEE SURGERY Bilateral 2016    No family history on file.  Social History   Tobacco Use  . Smoking status: Former Games developer  . Smokeless tobacco: Never Used  Substance Use Topics  . Alcohol use: No  . Drug use: No    Prior to Admission medications   Medication Sig Start Date End Date Taking? Authorizing Provider  amLODipine (NORVASC) 10 MG tablet Take 10 mg by mouth daily.      [provider]  bumetanide (BUMEX) 1 MG tablet Take 1 mg by mouth daily.      [provider]  fish oil-omega-3 fatty acids 1000 MG capsule Take 1 g by mouth daily.      [provider]  metoprolol (LOPRESSOR) 50 MG tablet Take 50 mg by mouth 2 (two) times daily.      [provider]  predniSONE (DELTASONE) 20 MG tablet 3 tabs po day one, then 2 po daily x 4 days 02/10/17   Rolan Bucco, MD  traMADol (ULTRAM) 50 MG tablet Take 50 mg by mouth every 6 (six) hours as needed. For pain. Maximum dose= 8 tablets per day     [provider]    Allergies Patient has no known allergies.   REVIEW OF SYSTEMS  Negative except as noted here or in the History of Present Illness.   PHYSICAL EXAMINATION  Initial Vital Signs Blood pressure (!) 153/82, pulse 74, temperature 97.9 F (36.6 C), temperature source Oral, resp. rate 18, height  (1.956 m), weight 124.7 kg (275 lb), SpO2 100 %.  Examination General: Well-developed, well-nourished male in no acute distress; appearance consistent with age of record HENT: normocephalic; atraumatic Eyes: Right pupil equal and reactive to light with arcus senilis; left corneal scarring with irregular pupil Neck: supple Heart: regular rate and rhyth Lungs: clear to auscultation bilaterally Abdomen: soft; nondistended; nontender; bowel sounds present Extremities: No deformity; full range of motion; chronic appearing edema of the lower legs Neurologic: Awake, alert and oriented; motor function intact in all extremities and symmetric; no facial droop Skin: Warm and dry; poorly healing laceration of left index finger with signs of infection:   Psychiatric: Normal mood and affect   RESULTS  Summary of this visit's results, reviewed by myself:   EKG Interpretation  Date/Time:    Ventricular Rate:    PR Interval:    QRS Duration:   QT Interval:  QTC Calculation:   R Axis:     Text Interpretation:        Laboratory Studies: No results found for this or any previous visit (from the past 24 hour(s)). Imaging Studies: No results found.  ED COURSE and MDM  Nursing notes and initial vitals signs, including pulse oximetry, reviewed.  Vitals:   04/04/18 0120 04/04/18 0122  BP:  (!) 153/82  Pulse:  74  Resp:  18  Temp:  97.9 F (36.6 C)  TempSrc:  Oral  SpO2:  100%  Weight: 124.7 kg (275 lb)   Height:  (1.956 m)    We will provide local wound care by nursing staff who will advise him on better wound treatment.  We will start him on Keflex for likely  infection.  PROCEDURES    ED DIAGNOSES     ICD-10-CM   1. Wound infection T14.8XXA    L08.9        Katricia Prehn, Jonny Ruiz, MD 04/04/18 0244    Paula Libra, MD 04/04/18 [phone]

## 2018-04-04 NOTE — ED Triage Notes (Signed)
Pt sustained laceration to finger last week and reports worsening pain tonight.

## 2018-05-27 DIAGNOSIS — N433 Hydrocele, unspecified: Secondary | ICD-10-CM | POA: Insufficient documentation

## 2018-06-26 DIAGNOSIS — Z860100 Personal history of colon polyps, unspecified: Secondary | ICD-10-CM | POA: Insufficient documentation

## 2018-10-01 ENCOUNTER — Encounter (HOSPITAL_BASED_OUTPATIENT_CLINIC_OR_DEPARTMENT_OTHER): Payer: Self-pay | Admitting: *Deleted

## 2018-10-01 ENCOUNTER — Other Ambulatory Visit: Payer: Self-pay

## 2018-10-01 ENCOUNTER — Emergency Department (HOSPITAL_BASED_OUTPATIENT_CLINIC_OR_DEPARTMENT_OTHER)
Admission: EM | Admit: 2018-10-01 | Discharge: 2018-10-01 | Disposition: A | Discharge status: Home / Self Care | Payer: Medicare Other | Attending: Emergency Medicine | Admitting: Emergency Medicine

## 2018-10-01 DIAGNOSIS — L03012 Cellulitis of left finger: Secondary | ICD-10-CM | POA: Insufficient documentation

## 2018-10-01 DIAGNOSIS — J45909 Unspecified asthma, uncomplicated: Secondary | ICD-10-CM | POA: Insufficient documentation

## 2018-10-01 DIAGNOSIS — Z87891 Personal history of nicotine dependence: Secondary | ICD-10-CM | POA: Insufficient documentation

## 2018-10-01 DIAGNOSIS — Z79899 Other long term (current) drug therapy: Secondary | ICD-10-CM | POA: Insufficient documentation

## 2018-10-01 DIAGNOSIS — M79645 Pain in left finger(s): Secondary | ICD-10-CM | POA: Diagnosis present

## 2018-10-01 DIAGNOSIS — I10 Essential (primary) hypertension: Secondary | ICD-10-CM | POA: Insufficient documentation

## 2018-10-01 HISTORY — DX: Unspecified asthma, uncomplicated: J45.909

## 2018-10-01 MED ORDER — LIDOCAINE HCL 2 % IJ SOLN
20.0000 mL | Freq: Once | INTRAMUSCULAR | Status: AC
  Administered 2018-10-01: 400 mg via INTRADERMAL
  Filled 2018-10-01: qty 20

## 2018-10-01 NOTE — ED Provider Notes (Signed)
Medical screening examination/treatment/procedure(s) were conducted as a shared visit with non-physician practitioner(s) and myself.  I personally evaluated the patient during the encounter.  Paronychia drained prior to my evaluation still has some surrounding cellulitis which will need antibiotics which she still has 7 days of his course from the urgent care.  Able to flex finger pretty much normal.  Discussed with him warm water soaks, compression, antibiotics and close follow-up.    Marily MemosMesner, Vermell Madrid, MD 10/01/18 [phone]

## 2018-10-01 NOTE — Discharge Instructions (Addendum)
Continue Keflex and replace dressing daily Wash the finger with soap and water daily Return if worsening

## 2018-10-01 NOTE — ED Triage Notes (Signed)
Pt cut a hang nail off of his left middle finger, it is now really big and yellow, not getting any better

## 2018-10-01 NOTE — ED Provider Notes (Signed)
[facility] EMERGENCY DEPARTMENT Provider Note   CSN: [id number] Arrival date & time: 10/01/18  1007     History   Chief Complaint Chief Complaint  Patient presents with  . Hand Pain    HPI Johnny Acosta is a 84 y.o. male who presents with an infection of the left middle finger.  Past medical history significant for hypertension and asthma.  The patient states that he had a hangnail which he pulled off last week and subsequently he has had gradually worsening swelling over the lateral nail fold.  He went to an urgent care on Saturday and they gave him Keflex for a paronychia.  He states that this has not helped and today became much more swollen and it is throbbing therefore he decided to come to the ED.  HPI  Past Medical History:  Diagnosis Date  . Asthma   . Hypertension     There are no active problems to display for this patient.   Past Surgical History:  Procedure Laterality Date  . KNEE SURGERY Bilateral 2016        Home Medications    Prior to Admission medications   Medication Sig Start Date End Date Taking? Authorizing Provider  amLODipine (NORVASC) 10 MG tablet Take 10 mg by mouth daily.      [provider]  bumetanide (BUMEX) 1 MG tablet Take 1 mg by mouth daily.      [provider]  cephALEXin (KEFLEX) 500 MG capsule Take 1 capsule (500 mg total) by mouth 4 (four) times daily. 04/04/18   Molpus, John, MD  fish oil-omega-3 fatty acids 1000 MG capsule Take 1 g by mouth daily.      [provider]  metoprolol (LOPRESSOR) 50 MG tablet Take 50 mg by mouth 2 (two) times daily.      [provider]  predniSONE (DELTASONE) 20 MG tablet 3 tabs po day one, then 2 po daily x 4 days 02/10/17   Rolan Bucco, MD  traMADol (ULTRAM) 50 MG tablet Take 50 mg by mouth every 6 (six) hours as needed. For pain. Maximum dose= 8 tablets per day     [provider]    Family History History reviewed. No pertinent family  history.  Social History Social History   Tobacco Use  . Smoking status: Former Games developer  . Smokeless tobacco: Never Used  Substance Use Topics  . Alcohol use: No  . Drug use: No     Allergies   Patient has no known allergies.   Review of Systems Review of Systems  Musculoskeletal: Positive for arthralgias.  Skin: Positive for color change.  Neurological: Negative for weakness and numbness.     Physical Exam Updated Vital Signs BP 129/65 (BP Location: Left Arm)   Pulse 83   Temp (!) 97.5 F (36.4 C) (Oral)   Resp 18   Ht 6\' 5"  (1.956 m)   Wt 124.7 kg   SpO2 92%   BMI 32.61 kg/m   Physical Exam  Constitutional: He is oriented to person, place, and time. He appears well-developed and well-nourished. No distress.  HENT:  Head: Normocephalic and atraumatic.  Eyes: Pupils are equal, round, and reactive to light. Conjunctivae are normal. Right eye exhibits no discharge. Left eye exhibits no discharge. No scleral icterus.  Neck: Normal range of motion.  Cardiovascular: Normal rate.  Pulmonary/Chest: Effort normal. No respiratory distress.  Abdominal: He exhibits no distension.  Musculoskeletal:  Left middle finger: Large paronychia over the  right lateral nail fold. Finger pad is soft.  Neurological: He is alert and oriented to person, place, and time.  Skin: Skin is warm and dry.  Psychiatric: He has a normal mood and affect. His behavior is normal.  Nursing note and vitals reviewed.    ED Treatments / Results  Labs (all labs ordered are listed, but only abnormal results are displayed) Labs Reviewed - No data to display  EKG None  Radiology No results found.  Procedures .Marland Kitchen.Incision and Drainage Date/Time: 10/01/2018 10:57 AM Performed by: Bethel BornGekas, Salihah Peckham Marie, PA-C Authorized by: Bethel BornGekas, Jarry Manon Marie, PA-C   Consent:    Consent obtained:  Verbal   Consent given by:  Patient   Risks discussed:  Pain, incomplete drainage and bleeding Location:     Indications for incision and drainage: paronychia.   Location:  Upper extremity   Upper extremity location:  Hand   Hand location:  L hand (middle finger) Pre-procedure details:    Skin preparation:  Betadine Anesthesia (see MAR for exact dosages):    Anesthesia method:  Nerve block   Block location:  Digital block of middle finger   Block needle gauge:  25 G   Block anesthetic:  Lidocaine 2% w/o epi   Block technique:  Digital   Block injection procedure:  Anatomic landmarks identified, anatomic landmarks palpated, introduced needle, negative aspiration for blood and incremental injection   Block outcome:  Anesthesia achieved Procedure type:    Complexity:  Simple Procedure details:    Needle aspiration: no     Incision types:  Stab incision   Incision depth:  Dermal   Scalpel blade:  11   Wound management:  Probed and deloculated and irrigated with saline   Drainage:  Bloody and purulent   Drainage amount:  Copious   Wound treatment:  Wound left open   Packing materials:  None Post-procedure details:    Patient tolerance of procedure:  Tolerated well, no immediate complications   (including critical care time)    Medications Ordered in ED Medications  lidocaine (XYLOCAINE) 2 % (with pres) injection 400 mg (400 mg Intradermal Given 10/01/18 1030)     Initial Impression / Assessment and Plan / ED Course  I have reviewed the triage vital signs and the nursing notes.  Pertinent labs & imaging results that were available during my care of the patient were reviewed by me and considered in my medical decision making (see chart for details).  84 year old male with a paronychia of the left middle finger. Digital block and incision and drainage was performed and copious purulent discharge was expressed.  The patient reports feeling much better after procedure.  Advised to continue Keflex and return if worsening.  Final Clinical Impressions(s) / ED Diagnoses   Final diagnoses:    Paronychia of left middle finger    ED Discharge Orders    None       Bethel BornGekas, Arrion Burruel Marie, PA-C 10/01/18 1100    Mesner, Barbara CowerJason, MD 10/01/18 1514

## 2018-12-29 IMAGING — CR DG CHEST 2V
2 series · 2 of 2 positions shown · non-contrast
Comparison: None.

CLINICAL DATA: Chest pain.

EXAM:
CHEST - 2 VIEW

[w chest pa]
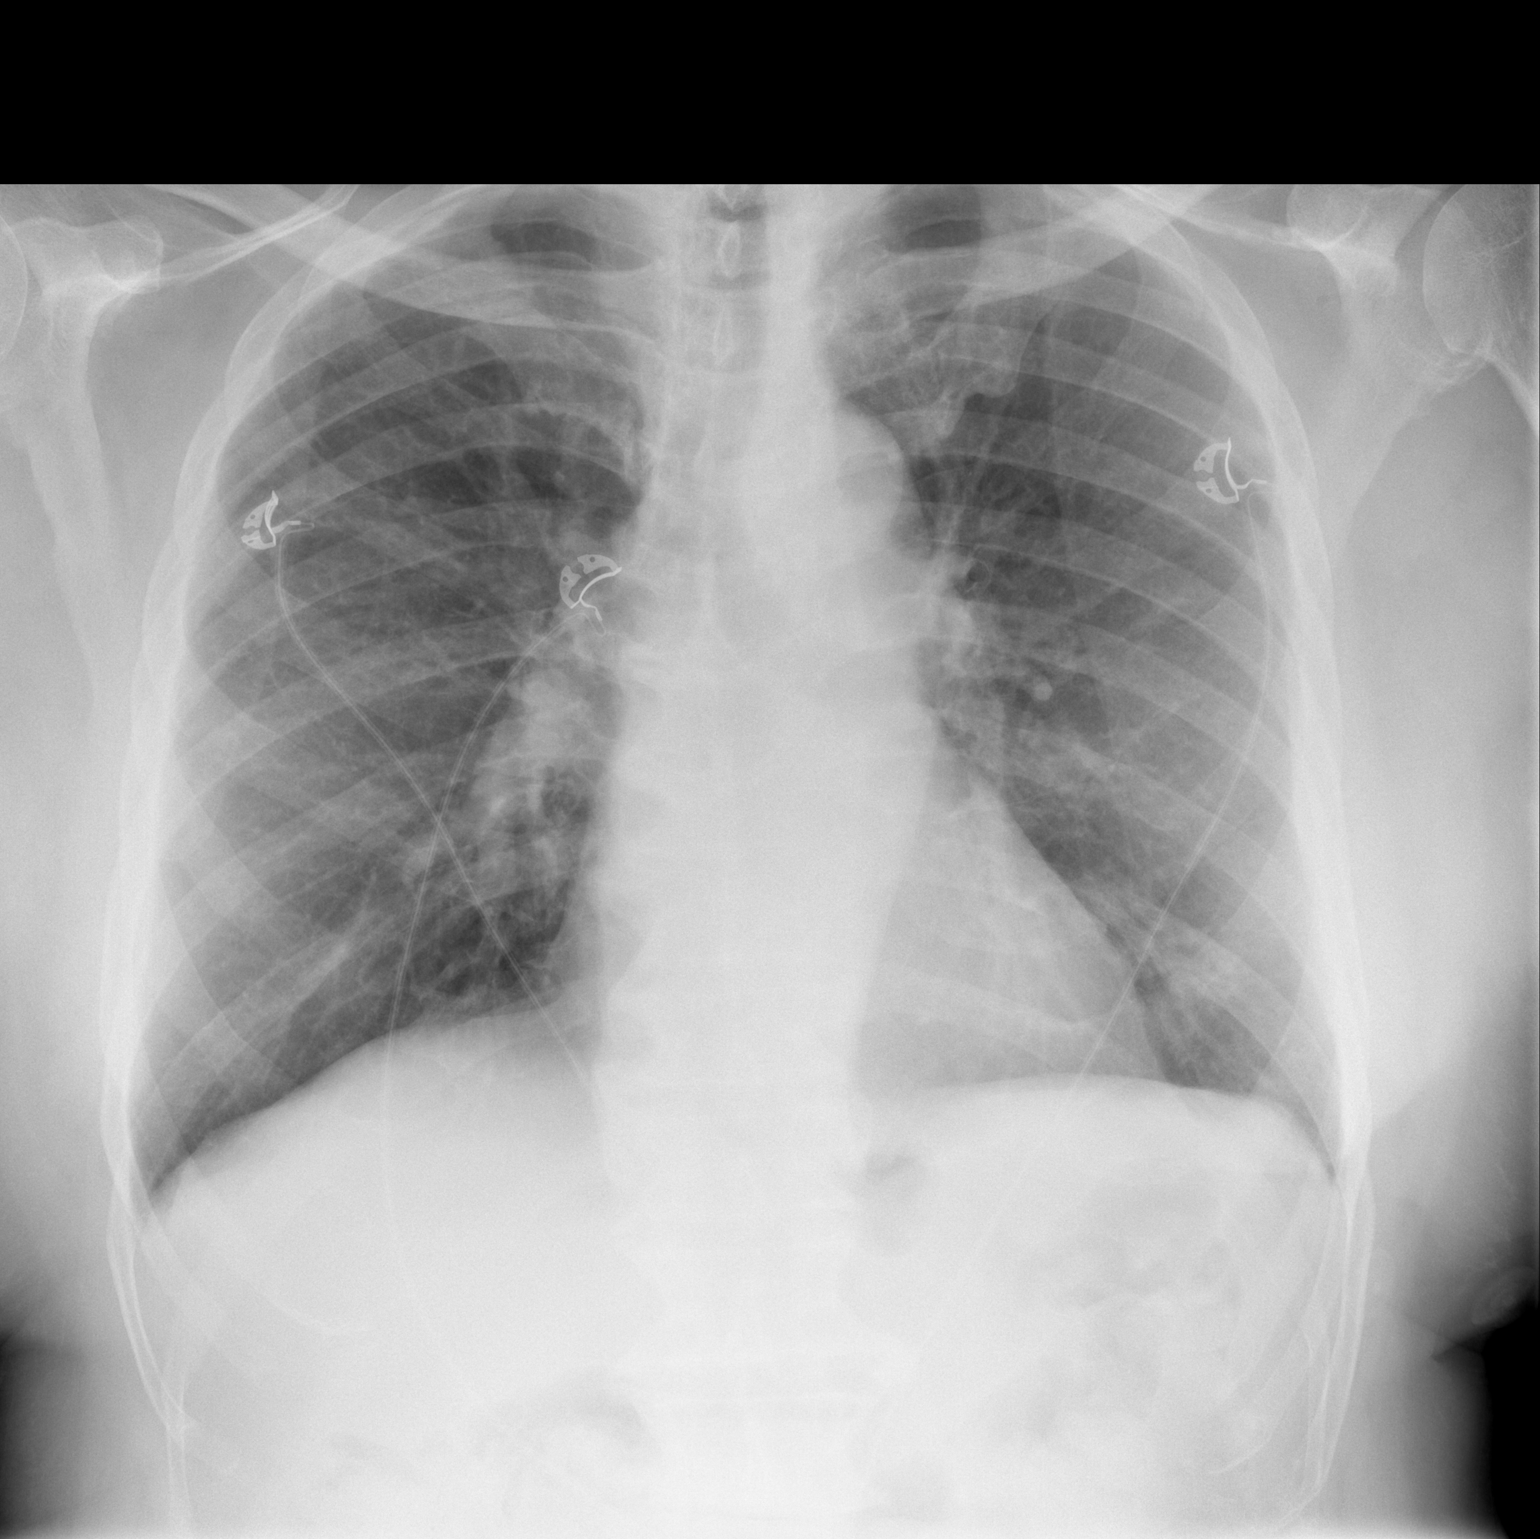

[w chest lat]
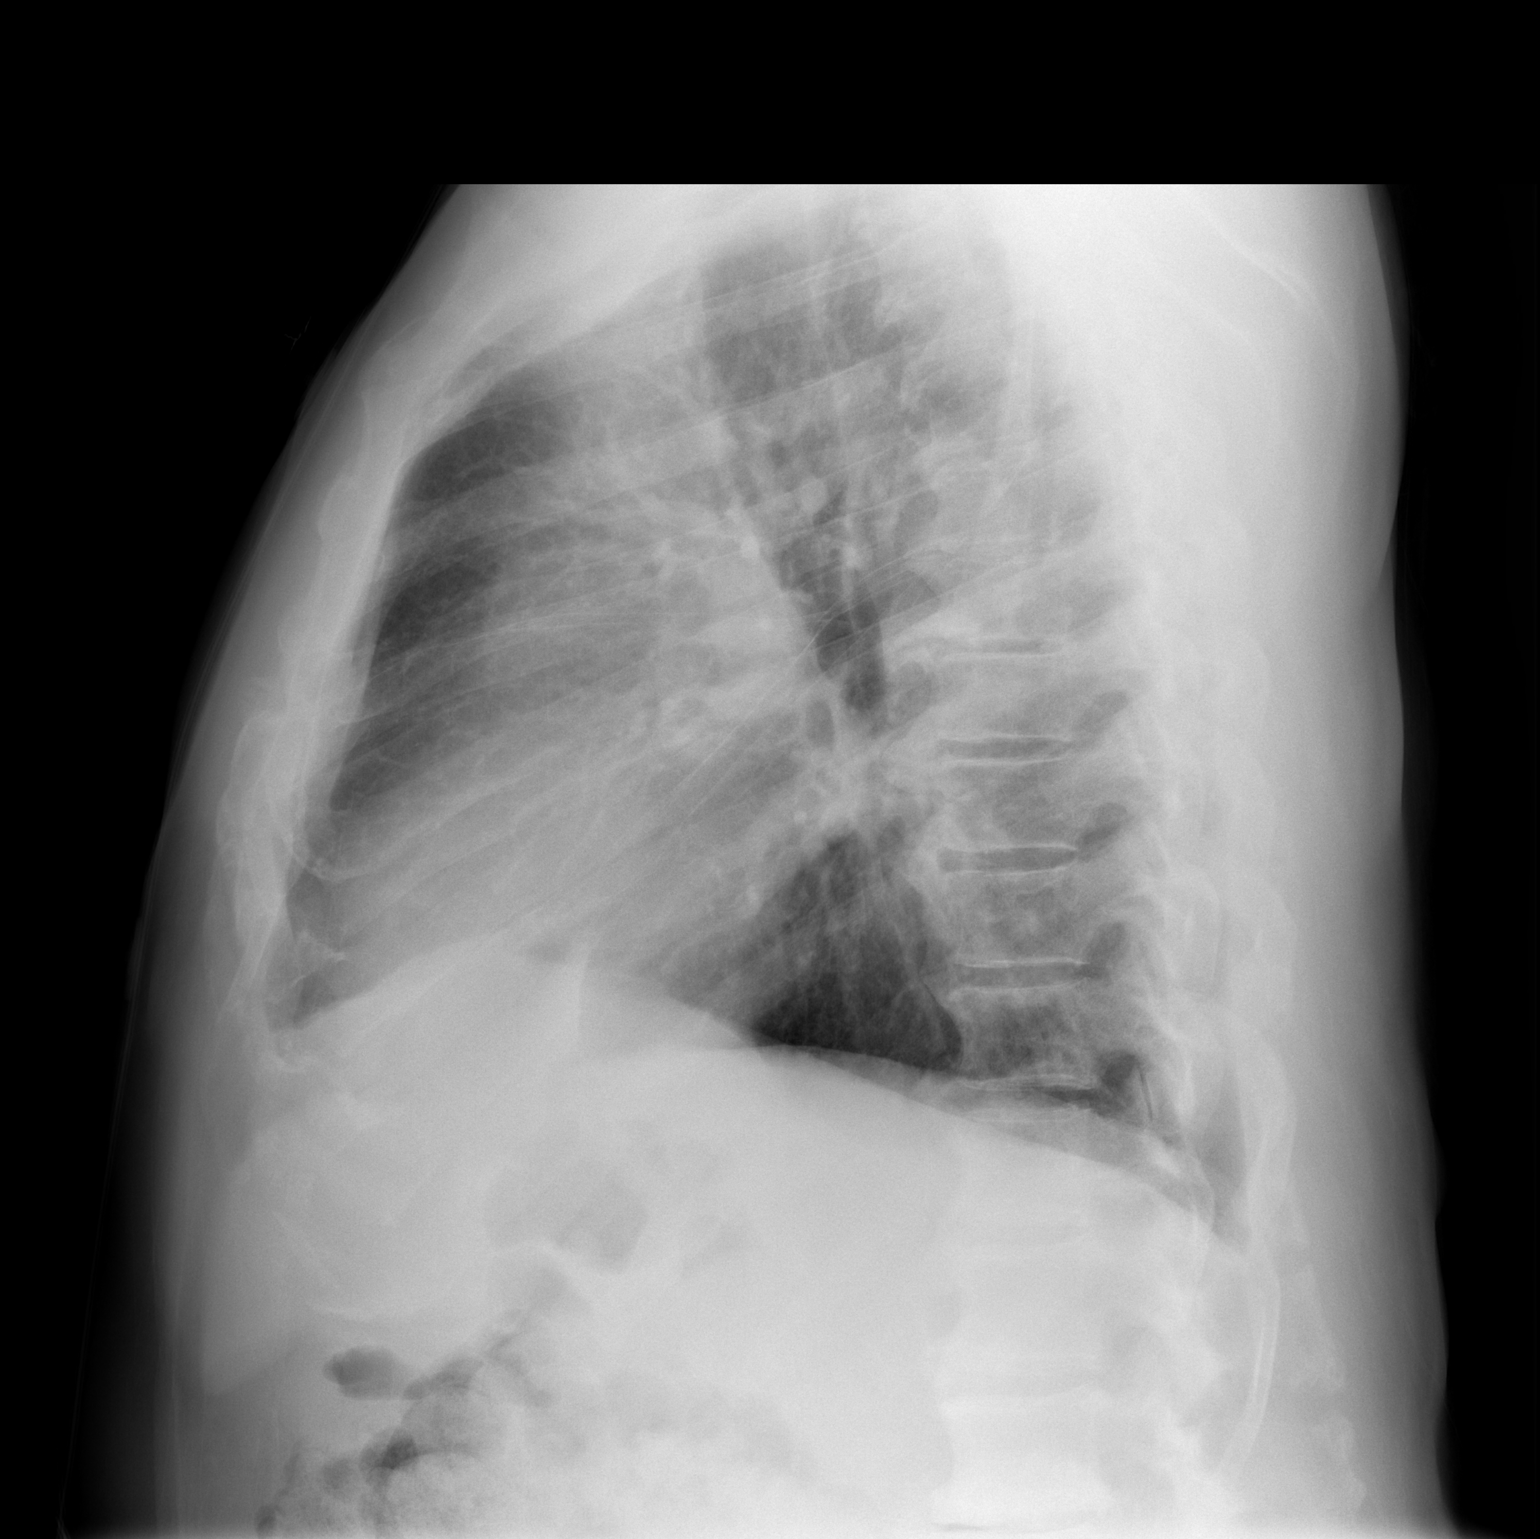

[2 of 2 positions shown; findings below may reference images not displayed]

FINDINGS: The heart size and mediastinal contours are within normal limits.
Both lungs are clear. The visualized skeletal structures are
unremarkable.
IMPRESSION: No active cardiopulmonary disease.

## 2019-05-17 ENCOUNTER — Encounter (HOSPITAL_BASED_OUTPATIENT_CLINIC_OR_DEPARTMENT_OTHER): Payer: Self-pay | Admitting: Emergency Medicine

## 2019-05-17 ENCOUNTER — Emergency Department (HOSPITAL_BASED_OUTPATIENT_CLINIC_OR_DEPARTMENT_OTHER)
Admission: EM | Admit: 2019-05-17 | Discharge: 2019-05-17 | Disposition: A | Discharge status: Home / Self Care | Payer: Medicare Other | Attending: Emergency Medicine | Admitting: Emergency Medicine

## 2019-05-17 ENCOUNTER — Other Ambulatory Visit: Payer: Self-pay

## 2019-05-17 DIAGNOSIS — I10 Essential (primary) hypertension: Secondary | ICD-10-CM | POA: Insufficient documentation

## 2019-05-17 DIAGNOSIS — J45909 Unspecified asthma, uncomplicated: Secondary | ICD-10-CM | POA: Diagnosis not present

## 2019-05-17 DIAGNOSIS — Z87891 Personal history of nicotine dependence: Secondary | ICD-10-CM | POA: Insufficient documentation

## 2019-05-17 DIAGNOSIS — M25522 Pain in left elbow: Secondary | ICD-10-CM | POA: Diagnosis present

## 2019-05-17 DIAGNOSIS — Z79899 Other long term (current) drug therapy: Secondary | ICD-10-CM | POA: Diagnosis not present

## 2019-05-17 MED ORDER — LIDOCAINE 4 % EX CREA
TOPICAL_CREAM | Freq: Once | CUTANEOUS | Status: AC
  Administered 2019-05-17: 1 via TOPICAL
  Filled 2019-05-17: qty 5

## 2019-05-17 MED ORDER — KETOROLAC TROMETHAMINE 60 MG/2ML IM SOLN
30.0000 mg | Freq: Once | INTRAMUSCULAR | Status: AC
  Administered 2019-05-17: 30 mg via INTRAMUSCULAR
  Filled 2019-05-17: qty 2

## 2019-05-17 NOTE — ED Notes (Signed)
Pt understood dc material. NAD noted. All questions answered to satisfaction. Pt escorted to check out counter. 

## 2019-05-17 NOTE — ED Triage Notes (Signed)
Pt states his left elbow is bothering him. States it has been hurting for awhile. Last night the pain increased and woke him up. Denies injury or fever.

## 2019-05-17 NOTE — ED Triage Notes (Signed)
Took 400mg  ibuprofen around 0300 without relief

## 2019-05-17 NOTE — ED Notes (Signed)
Pt declined the ace wrap

## 2019-05-18 NOTE — ED Provider Notes (Signed)
[facility] EMERGENCY DEPARTMENT Provider Note   CSN: [id number] Arrival date & time: 05/17/19  0515     History   Chief Complaint Chief Complaint  Patient presents with  . Elbow Pain    HPI Johnny Acosta is a 85 y.o. male.     Left elbow pain intermittently for awhile worsened this AM, here for eval. No fevers, nausea, vomiting, systemic symptoms. No trauma. No h/o same.    Extremity Pain This is a new problem. The current episode started yesterday. Pertinent negatives include no chest pain and no headaches. Nothing aggravates the symptoms. Nothing relieves the symptoms. He has tried nothing for the symptoms.    Past Medical History:  Diagnosis Date  . Asthma   . Hypertension     There are no active problems to display for this patient.   Past Surgical History:  Procedure Laterality Date  . KNEE SURGERY Bilateral 2016        Home Medications    Prior to Admission medications   Medication Sig Start Date End Date Taking? Authorizing Provider  amLODipine (NORVASC) 10 MG tablet Take 10 mg by mouth daily.      [provider]  bumetanide (BUMEX) 1 MG tablet Take 1 mg by mouth daily.      [provider]  cephALEXin (KEFLEX) 500 MG capsule Take 1 capsule (500 mg total) by mouth 4 (four) times daily. 04/04/18   Molpus, John, MD  fish oil-omega-3 fatty acids 1000 MG capsule Take 1 g by mouth daily.      [provider]  metoprolol (LOPRESSOR) 50 MG tablet Take 50 mg by mouth 2 (two) times daily.      [provider]  predniSONE (DELTASONE) 20 MG tablet 3 tabs po day one, then 2 po daily x 4 days 02/10/17   Malvin Johns, MD  traMADol (ULTRAM) 50 MG tablet Take 50 mg by mouth every 6 (six) hours as needed. For pain. Maximum dose= 8 tablets per day     [provider]    Family History No family history on file.  Social History Social History   Tobacco Use  . Smoking status: Former Research scientist (life sciences)  . Smokeless  tobacco: Never Used  Substance Use Topics  . Alcohol use: No  . Drug use: No     Allergies   Patient has no known allergies.   Review of Systems Review of Systems  Cardiovascular: Negative for chest pain.  Neurological: Negative for headaches.  All other systems reviewed and are negative.    Physical Exam Updated Vital Signs BP (!) 146/80   Pulse 73   Temp 97.9 F (36.6 C) (Oral)   Resp 18   Ht 6\' 5"  (1.956 m)   Wt 122.5 kg   SpO2 98%   BMI 32.02 kg/m   Physical Exam Vitals signs and nursing note reviewed.  Constitutional:      Appearance: He is well-developed.  HENT:     Head: Normocephalic and atraumatic.     Mouth/Throat:     Pharynx: Oropharynx is clear.  Eyes:     Extraocular Movements: Extraocular movements intact.     Conjunctiva/sclera: Conjunctivae normal.  Neck:     Musculoskeletal: Normal range of motion.  Cardiovascular:     Rate and Rhythm: Normal rate.  Pulmonary:     Effort: Pulmonary effort is normal. No respiratory distress.  Abdominal:     General: Bowel sounds are normal. There is no distension.  Palpations: Abdomen is soft.  Musculoskeletal: Normal range of motion.        General: No swelling, tenderness, deformity or signs of injury.     Right lower leg: No edema.     Left lower leg: No edema.  Skin:    General: Skin is warm and dry.  Neurological:     General: No focal deficit present.     Mental Status: He is alert.      ED Treatments / Results  Labs (all labs ordered are listed, but only abnormal results are displayed) Labs Reviewed - No data to display  EKG None  Radiology No results found.  Procedures Procedures (including critical care time)  Medications Ordered in ED Medications  lidocaine (LMX) 4 % cream (1 application Topical Given 05/17/19 0550)  ketorolac (TORADOL) injection 30 mg (30 mg Intramuscular Given 05/17/19 0549)     Initial Impression / Assessment and Plan / ED Course  I have reviewed the  triage vital signs and the nursing notes.  Pertinent labs & imaging results that were available during my care of the patient were reviewed by me and considered in my medical decision making (see chart for details).        Atraumatic L elbow pain without evidence of septic arthritis, gouty arthritis or effusion. No trauma. Pain pretty much resolved now without intervention. No indication for XR at this time. No indication for arthrocentesis. Doubt cardiac equivalent. Plan for symptomatic care and pcp follow up.  Final Clinical Impressions(s) / ED Diagnoses   Final diagnoses:  Left elbow pain    ED Discharge Orders    None       Shadee Montoya, Barbara CowerJason, MD 05/18/19 [phone]

## 2019-07-03 ENCOUNTER — Other Ambulatory Visit: Payer: Self-pay

## 2019-07-03 ENCOUNTER — Encounter (HOSPITAL_BASED_OUTPATIENT_CLINIC_OR_DEPARTMENT_OTHER): Payer: Self-pay | Admitting: *Deleted

## 2019-07-03 ENCOUNTER — Emergency Department (HOSPITAL_BASED_OUTPATIENT_CLINIC_OR_DEPARTMENT_OTHER)
Admission: EM | Admit: 2019-07-03 | Discharge: 2019-07-03 | Disposition: A | Discharge status: Home / Self Care | Payer: Medicare Other | Attending: Emergency Medicine | Admitting: Emergency Medicine

## 2019-07-03 DIAGNOSIS — Z79899 Other long term (current) drug therapy: Secondary | ICD-10-CM | POA: Diagnosis not present

## 2019-07-03 DIAGNOSIS — L259 Unspecified contact dermatitis, unspecified cause: Secondary | ICD-10-CM | POA: Insufficient documentation

## 2019-07-03 DIAGNOSIS — R21 Rash and other nonspecific skin eruption: Secondary | ICD-10-CM | POA: Diagnosis present

## 2019-07-03 DIAGNOSIS — J45909 Unspecified asthma, uncomplicated: Secondary | ICD-10-CM | POA: Diagnosis not present

## 2019-07-03 DIAGNOSIS — Z87891 Personal history of nicotine dependence: Secondary | ICD-10-CM | POA: Diagnosis not present

## 2019-07-03 DIAGNOSIS — I10 Essential (primary) hypertension: Secondary | ICD-10-CM | POA: Insufficient documentation

## 2019-07-03 MED ORDER — HYDROCORTISONE 2.5 % EX LOTN: TOPICAL_LOTION | Freq: Two times a day (BID) | CUTANEOUS | 0 refills | Status: AC

## 2019-07-03 NOTE — ED Triage Notes (Signed)
C/o rash to bil arms x 1 week

## 2019-07-03 NOTE — Discharge Instructions (Signed)
You were seen in the emergency department for a rash on your forearms.  This is likely some sort of contact dermatitis or something you are allergic to that was on your skin.  Possibly the new soap which you should avoid now.  We are prescribing you a steroid lotion that you can apply to the affected areas twice a day.  Please use a gentle soap.  Follow-up with your doctor return if any worsening symptoms.

## 2019-07-03 NOTE — ED Provider Notes (Signed)
[facility] EMERGENCY DEPARTMENT Provider Note   CSN: [id number] Arrival date & time: 07/03/19  1841     History   Chief Complaint Chief Complaint  Patient presents with  . Rash    HPI Johnny Acosta is a 85 y.o. male.  He is complaining a rash on his arms that is been going on for about a week.  York SpanielSaid it started as a small patch on his left forearm and now involving both forearms.  The only thing he can think of is a new soap that he has been using.  He said it is itchy.  Denies any other places that are involved and no chest pain shortness of breath nausea vomiting diarrhea fevers or cough.  He is tried nothing for it.     The history is provided by the patient.  Rash Location:  Shoulder/arm Shoulder/arm rash location:  L forearm and R forearm Quality: itchiness and swelling   Quality: not draining, not painful and not peeling   Severity:  Moderate Onset quality:  Gradual Timing:  Constant Progression:  Unchanged Chronicity:  New Context: new detergent/soap   Relieved by:  None tried Worsened by:  Nothing Ineffective treatments:  None tried Associated symptoms: no abdominal pain, no diarrhea, no fever, no nausea, no shortness of breath and not vomiting     Past Medical History:  Diagnosis Date  . Asthma   . Hypertension     There are no active problems to display for this patient.   Past Surgical History:  Procedure Laterality Date  . KNEE SURGERY Bilateral 2016        Home Medications    Prior to Admission medications   Medication Sig Start Date End Date Taking? Authorizing Provider  amLODipine (NORVASC) 10 MG tablet Take 10 mg by mouth daily.      [provider]  bumetanide (BUMEX) 1 MG tablet Take 1 mg by mouth daily.      [provider]  cephALEXin (KEFLEX) 500 MG capsule Take 1 capsule (500 mg total) by mouth 4 (four) times daily. 04/04/18   Molpus, John, MD  fish oil-omega-3 fatty acids 1000 MG capsule Take 1 g by mouth  daily.      [provider]  metoprolol (LOPRESSOR) 50 MG tablet Take 50 mg by mouth 2 (two) times daily.      [provider]  predniSONE (DELTASONE) 20 MG tablet 3 tabs po day one, then 2 po daily x 4 days 02/10/17   Rolan BuccoBelfi, Melanie, MD  traMADol (ULTRAM) 50 MG tablet Take 50 mg by mouth every 6 (six) hours as needed. For pain. Maximum dose= 8 tablets per day     [provider]    Family History History reviewed. No pertinent family history.  Social History Social History   Tobacco Use  . Smoking status: Former Games developermoker  . Smokeless tobacco: Never Used  Substance Use Topics  . Alcohol use: No  . Drug use: No     Allergies   Patient has no known allergies.   Review of Systems Review of Systems  Constitutional: Negative for fever.  Respiratory: Negative for shortness of breath.   Gastrointestinal: Negative for abdominal pain, diarrhea, nausea and vomiting.  Skin: Positive for rash.     Physical Exam Updated Vital Signs BP (!) 142/74   Pulse (!) 51   Temp 98 F (36.7 C) (Oral)   Resp 18   Ht 6\' 5"  (1.956 m)   Wt 124.7  kg   SpO2 97%   BMI 32.61 kg/m   Physical Exam Vitals signs and nursing note reviewed.  Constitutional:      Appearance: He is well-developed.  HENT:     Head: Normocephalic and atraumatic.  Eyes:     Conjunctiva/sclera: Conjunctivae normal.  Neck:     Musculoskeletal: Neck supple.  Cardiovascular:     Rate and Rhythm: Regular rhythm. Bradycardia present.     Pulses: Normal pulses.  Pulmonary:     Effort: Pulmonary effort is normal.     Breath sounds: Normal breath sounds.  Musculoskeletal: Normal range of motion.        General: No signs of injury.  Skin:    General: Skin is warm and dry.     Capillary Refill: Capillary refill takes less than 2 seconds.     Findings: Rash present.     Comments: He has a rash involving both forearms into the antecubitus.  Multiple small raised papules without any pustules.   Neurological:     General: No focal deficit present.     Mental Status: He is alert.     GCS: GCS eye subscore is 4. GCS verbal subscore is 5. GCS motor subscore is 6.      ED Treatments / Results  Labs (all labs ordered are listed, but only abnormal results are displayed) Labs Reviewed - No data to display  EKG None  Radiology No results found.  Procedures Procedures (including critical care time)  Medications Ordered in ED Medications - No data to display   Initial Impression / Assessment and Plan / ED Course  I have reviewed the triage vital signs and the nursing notes.  Pertinent labs & imaging results that were available during my care of the patient were reviewed by me and considered in my medical decision making (see chart for details).        Patient appears to have some sort of contact dermatitis on his forearm.  He does endorse a new soap.  He denies any recent change in medications.  He has no other systemic symptoms.  We will trial on topical steroid.  Final Clinical Impressions(s) / ED Diagnoses   Final diagnoses:  Contact dermatitis, unspecified contact dermatitis type, unspecified trigger    ED Discharge Orders         Ordered    hydrocortisone 2.5 % lotion  2 times daily     07/03/19 1902           Hayden Rasmussen, MD 07/03/19 1920

## 2019-07-03 NOTE — ED Notes (Signed)
Rash bilateral arms  X 2 weeks  itching Denies rash any place else  No pain

## 2019-12-16 ENCOUNTER — Emergency Department (HOSPITAL_BASED_OUTPATIENT_CLINIC_OR_DEPARTMENT_OTHER)
Admission: EM | Admit: 2019-12-16 | Discharge: 2019-12-16 | Disposition: A | Discharge status: Home / Self Care | Payer: Medicare Other | Attending: Emergency Medicine | Admitting: Emergency Medicine

## 2019-12-16 ENCOUNTER — Other Ambulatory Visit: Payer: Self-pay

## 2019-12-16 ENCOUNTER — Encounter (HOSPITAL_BASED_OUTPATIENT_CLINIC_OR_DEPARTMENT_OTHER): Payer: Self-pay | Admitting: Emergency Medicine

## 2019-12-16 DIAGNOSIS — J45909 Unspecified asthma, uncomplicated: Secondary | ICD-10-CM | POA: Insufficient documentation

## 2019-12-16 DIAGNOSIS — Z87891 Personal history of nicotine dependence: Secondary | ICD-10-CM | POA: Insufficient documentation

## 2019-12-16 DIAGNOSIS — L723 Sebaceous cyst: Secondary | ICD-10-CM | POA: Diagnosis present

## 2019-12-16 DIAGNOSIS — I10 Essential (primary) hypertension: Secondary | ICD-10-CM | POA: Diagnosis not present

## 2019-12-16 DIAGNOSIS — Z79899 Other long term (current) drug therapy: Secondary | ICD-10-CM | POA: Diagnosis not present

## 2019-12-16 NOTE — ED Triage Notes (Signed)
Pt concerned about a "knot" to thoracic back for several days. Denies pain.

## 2019-12-16 NOTE — Discharge Instructions (Addendum)
Recommend following up with a general surgeon regarding the suspected sebaceous cyst on your back.  If you would like to follow-up with [facility], you should contact your primary care doctor at [phone] to determine which provider you were referred to.  If you would like to follow up with someone else, you may call the number provided for [facility].  If you develop redness, increasing size, drainage, fever, pain, return to ER for reassessment.

## 2019-12-16 NOTE — ED Notes (Signed)
Pt verbalized understanding of dc instructions.

## 2019-12-16 NOTE — ED Provider Notes (Signed)
[facility] EMERGENCY DEPARTMENT Provider Note   CSN: [id number] Arrival date & time: 12/16/19  1033     History Chief Complaint  Patient presents with  . Cyst    Johnny Acosta is a 86 y.o. male.  Presents ER with chief complaint cyst on back.  Patient states he has had this for a couple months, has discussed it with his primary care doctor who recommended he follow-up with a surgeon however he did not follow-up with surgeon.  States he came to ER today to get it fixed.  He has not noticed any change in size over the past few weeks.  No redness, no fevers, not painful.  Completed chart review, care everywhere [facility] PCP note in December for centimeter diameter likely sebaceous cyst.  Referred to general surgery.  HPI     Past Medical History:  Diagnosis Date  . Asthma   . Hypertension     There are no problems to display for this patient.   Past Surgical History:  Procedure Laterality Date  . KNEE SURGERY Bilateral 2016       No family history on file.  Social History   Tobacco Use  . Smoking status: Former Research scientist (life sciences)  . Smokeless tobacco: Never Used  Substance Use Topics  . Alcohol use: No  . Drug use: No    Home Medications Prior to Admission medications   Medication Sig Start Date End Date Taking? Authorizing Provider  amLODipine (NORVASC) 10 MG tablet Take 10 mg by mouth daily.      [provider]  bumetanide (BUMEX) 1 MG tablet Take 1 mg by mouth daily.      [provider]  cephALEXin (KEFLEX) 500 MG capsule Take 1 capsule (500 mg total) by mouth 4 (four) times daily. 04/04/18   Molpus, John, MD  fish oil-omega-3 fatty acids 1000 MG capsule Take 1 g by mouth daily.      [provider]  hydrocortisone 2.5 % lotion Apply topically 2 (two) times daily. 07/03/19   Hayden Rasmussen, MD  metoprolol (LOPRESSOR) 50 MG tablet Take 50 mg by mouth 2 (two) times daily.      [provider]  predniSONE (DELTASONE) 20  MG tablet 3 tabs po day one, then 2 po daily x 4 days 02/10/17   Malvin Johns, MD  traMADol (ULTRAM) 50 MG tablet Take 50 mg by mouth every 6 (six) hours as needed. For pain. Maximum dose= 8 tablets per day     [provider]    Allergies    Patient has no known allergies.  Review of Systems   Review of Systems  Constitutional: Negative for chills and fever.  HENT: Negative for ear pain and sore throat.   Eyes: Negative for pain and visual disturbance.  Respiratory: Negative for cough and shortness of breath.   Cardiovascular: Negative for chest pain and palpitations.  Gastrointestinal: Negative for abdominal pain and vomiting.  Genitourinary: Negative for dysuria and hematuria.  Musculoskeletal: Negative for arthralgias and back pain.  Skin: Negative for color change and rash.  Neurological: Negative for seizures and syncope.  All other systems reviewed and are negative.   Physical Exam Updated Vital Signs BP (!) 143/71 (BP Location: Right Arm)   Pulse 75   Temp 98 F (36.7 C) (Oral)   Resp 18   Ht 6\' 5"  (1.956 m)   Wt 124.7 kg   SpO2 100%   BMI 32.61 kg/m   Physical Exam Vitals  and nursing note reviewed.  Constitutional:      Appearance: He is well-developed.  HENT:     Head: Normocephalic and atraumatic.  Eyes:     Conjunctiva/sclera: Conjunctivae normal.  Cardiovascular:     Rate and Rhythm: Normal rate and regular rhythm.     Heart sounds: No murmur.  Pulmonary:     Effort: Pulmonary effort is normal. No respiratory distress.     Breath sounds: Normal breath sounds.  Abdominal:     Palpations: Abdomen is soft.     Tenderness: There is no abdominal tenderness.  Musculoskeletal:     Cervical back: Neck supple.  Skin:    General: Skin is warm and dry.     Comments: 3cm diameter mildly raised area, moderately firm just lateral to midline over lower thoracic spine; no TTP, no overlying erythema  Neurological:     General: No focal deficit present.       Mental Status: He is alert and oriented to person, place, and time.     ED Results / Procedures / Treatments   Labs (all labs ordered are listed, but only abnormal results are displayed) Labs Reviewed - No data to display  EKG None  Radiology No results found.  Procedures Procedures (including critical care time)  Medications Ordered in ED Medications - No data to display  ED Course  I have reviewed the triage vital signs and the nursing notes.  Pertinent labs & imaging results that were available during my care of the patient were reviewed by me and considered in my medical decision making (see chart for details).    MDM Rules/Calculators/A&P                      86 year old male who presented to ER with complaint of possible cyst on his back.  On exam suspect he has sebaceous cyst or possibly lipoma.  Does not appear to be infected.  Based on my chart review, size is unchanged from early December when he was evaluated by his PCP.  Patient did not follow-up with general surgery as they recommended.  I recommend patient have follow-up with either general surgery or dermatology.  I recommend he discuss again with his primary doctor who can facilitate these referrals.  Provided information for referrals.  As I do not believe this is an abscess, not acutely infected, do not believe drainage indicated at this time in ER.  Reviewed return precautions, will discharge home.    After the discussed management above, the patient was determined to be safe for discharge.  The patient was in agreement with this plan and all questions regarding their care were answered.  ED return precautions were discussed and the patient will return to the ED with any significant worsening of condition.   Final Clinical Impression(s) / ED Diagnoses Final diagnoses:  Sebaceous cyst    Rx / DC Orders ED Discharge Orders    None       Milagros Loll, MD 12/17/19 [phone]

## 2020-01-03 ENCOUNTER — Ambulatory Visit: Payer: Medicare Other

## 2020-07-14 DIAGNOSIS — R413 Other amnesia: Secondary | ICD-10-CM | POA: Insufficient documentation

## 2023-12-18 ENCOUNTER — Ambulatory Visit: Payer: Medicare Other | Admitting: Family Medicine

## 2023-12-18 DIAGNOSIS — Z0000 Encounter for general adult medical examination without abnormal findings: Secondary | ICD-10-CM
